# Patient Record
Sex: Female | Born: 1935 | Race: White | Hispanic: No | Smoking: Never smoker
Health system: Southern US, Community
[De-identification: ages and names within clinical notes are randomized; demographics above are authoritative.]

## PROBLEM LIST (undated history)

## (undated) DIAGNOSIS — N289 Disorder of kidney and ureter, unspecified: Secondary | ICD-10-CM

## (undated) DIAGNOSIS — E079 Disorder of thyroid, unspecified: Secondary | ICD-10-CM

## (undated) DIAGNOSIS — J45909 Unspecified asthma, uncomplicated: Secondary | ICD-10-CM

## (undated) DIAGNOSIS — I10 Essential (primary) hypertension: Secondary | ICD-10-CM

## (undated) DIAGNOSIS — I4891 Unspecified atrial fibrillation: Secondary | ICD-10-CM

## (undated) DIAGNOSIS — I509 Heart failure, unspecified: Secondary | ICD-10-CM

## (undated) HISTORY — PX: MASTECTOMY: SHX3

## (undated) HISTORY — PX: ABDOMINAL HYSTERECTOMY: SHX81

---

## 2016-08-09 ENCOUNTER — Emergency Department (HOSPITAL_COMMUNITY)

## 2016-08-09 ENCOUNTER — Emergency Department (HOSPITAL_COMMUNITY)
Admission: EM | Admit: 2016-08-09 | Discharge: 2016-08-09 | Disposition: A | Discharge status: Home / Self Care | Attending: Emergency Medicine | Admitting: Emergency Medicine

## 2016-08-09 ENCOUNTER — Encounter (HOSPITAL_COMMUNITY): Payer: Self-pay | Admitting: Emergency Medicine

## 2016-08-09 DIAGNOSIS — S0003XA Contusion of scalp, initial encounter: Secondary | ICD-10-CM | POA: Insufficient documentation

## 2016-08-09 DIAGNOSIS — Z79899 Other long term (current) drug therapy: Secondary | ICD-10-CM | POA: Insufficient documentation

## 2016-08-09 DIAGNOSIS — I110 Hypertensive heart disease with heart failure: Secondary | ICD-10-CM | POA: Diagnosis not present

## 2016-08-09 DIAGNOSIS — Y999 Unspecified external cause status: Secondary | ICD-10-CM | POA: Insufficient documentation

## 2016-08-09 DIAGNOSIS — M25552 Pain in left hip: Secondary | ICD-10-CM | POA: Insufficient documentation

## 2016-08-09 DIAGNOSIS — I509 Heart failure, unspecified: Secondary | ICD-10-CM | POA: Diagnosis not present

## 2016-08-09 DIAGNOSIS — S40012A Contusion of left shoulder, initial encounter: Secondary | ICD-10-CM

## 2016-08-09 DIAGNOSIS — Y939 Activity, unspecified: Secondary | ICD-10-CM | POA: Diagnosis not present

## 2016-08-09 DIAGNOSIS — J45909 Unspecified asthma, uncomplicated: Secondary | ICD-10-CM | POA: Insufficient documentation

## 2016-08-09 DIAGNOSIS — W1839XA Other fall on same level, initial encounter: Secondary | ICD-10-CM | POA: Diagnosis not present

## 2016-08-09 DIAGNOSIS — Y929 Unspecified place or not applicable: Secondary | ICD-10-CM | POA: Diagnosis not present

## 2016-08-09 DIAGNOSIS — S0990XA Unspecified injury of head, initial encounter: Secondary | ICD-10-CM | POA: Diagnosis present

## 2016-08-09 HISTORY — DX: Disorder of thyroid, unspecified: E07.9

## 2016-08-09 HISTORY — DX: Unspecified atrial fibrillation: I48.91

## 2016-08-09 HISTORY — DX: Essential (primary) hypertension: I10

## 2016-08-09 HISTORY — DX: Disorder of kidney and ureter, unspecified: N28.9

## 2016-08-09 HISTORY — DX: Unspecified asthma, uncomplicated: J45.909

## 2016-08-09 HISTORY — DX: Heart failure, unspecified: I50.9

## 2016-08-09 NOTE — ED Provider Notes (Signed)
[facility]Y DEPT Provider Note   CSN: [id number] Arrival date & time: 08/09/16  0056   By signing my name below, I, Clovis PuAvnee Patel, attest that this documentation has been prepared under the direction and in the presence of Gilda Creasehristopher J Yarden Hillis, MD  Electronically Signed: Clovis PuAvnee Patel, ED Scribe. 08/09/16. 1:15 AM.   History   Chief Complaint Chief Complaint  Patient presents with  . Fall    The history is provided by the patient. No language interpreter was used.   HPI Comments:  Gloria Orozco is a 83 y.o. female, with a hx of neck pain due to neuralgia, A-fib, CHF and HTN, who presents to the Emergency Department, via EMS, complaining of acute onset left sided posterior head pain s/p a fall which occurred PTA. She also reports left shoulder pain,  left hip pain and neck pain. Pt states she fell against a door. No alleviating factors noted. Pt denies syncope, lower extremity pain and any other associated symptoms at this time. Family reports she is on lasix.   Past Medical History:  Diagnosis Date  . Asthma   . Atrial fibrillation (HCC)   . CHF (congestive heart failure) (HCC)   . Hypertension   . Renal disorder   . Thyroid disease     There are no active problems to display for this patient.   Past Surgical History:  Procedure Laterality Date  . ABDOMINAL HYSTERECTOMY    . MASTECTOMY      OB History    No data available       Home Medications    Prior to Admission medications   Medication Sig Start Date End Date Taking? Authorizing Provider  amitriptyline (ELAVIL) 10 MG tablet Take 10 mg by mouth at bedtime.   Yes Historical Provider, MD  Calcium Carbonate-Vitamin D (CALCIUM 600+D PO) Take 1 tablet by mouth every morning.   Yes Historical Provider, MD  cholecalciferol (VITAMIN D) 1000 units tablet Take 1,000 Units by mouth every morning.   Yes Historical Provider, MD  ferrous sulfate 325 (65 FE) MG tablet Take 325 mg by mouth daily with breakfast.   Yes  Historical Provider, MD  gabapentin (NEURONTIN) 100 MG capsule Take 100 mg by mouth daily.   Yes Historical Provider, MD  hydroxychloroquine (PLAQUENIL) 200 MG tablet Take 200 mg by mouth 2 (two) times daily.   Yes Historical Provider, MD  mirtazapine (REMERON) 15 MG tablet Take 15 mg by mouth at bedtime.   Yes Historical Provider, MD  Multiple Vitamin (MULTIVITAMIN WITH MINERALS) TABS tablet Take 1 tablet by mouth every morning.   Yes Historical Provider, MD  nitroGLYCERIN (NITROSTAT) 0.4 MG SL tablet Place 0.4 mg under the tongue every 5 (five) minutes as needed for chest pain.   Yes Historical Provider, MD  polyethylene glycol (MIRALAX / GLYCOLAX) packet Take 17 g by mouth daily as needed for mild constipation.   Yes Historical Provider, MD  traMADol (ULTRAM) 50 MG tablet Take 50 mg by mouth every 6 (six) hours as needed for moderate pain.   Yes Historical Provider, MD  traZODone (DESYREL) 50 MG tablet Take 50 mg by mouth at bedtime as needed for sleep.   Yes Historical Provider, MD  vitamin C (ASCORBIC ACID) 500 MG tablet Take 500 mg by mouth every morning.   Yes Historical Provider, MD    Family History No family history on file.  Social History Social History  Substance Use Topics  . Smoking status: Never Smoker  . Smokeless tobacco: Current  User    Types: Chew, Snuff  . Alcohol use No     Allergies   Patient has no known allergies.   Review of Systems Review of Systems  Musculoskeletal: Positive for arthralgias, myalgias and neck pain.  Neurological: Positive for headaches. Negative for syncope.  All other systems reviewed and are negative.  Physical Exam Updated Vital Signs BP 139/82   Pulse (!) 58   Temp 97.4 F (36.3 C) (Oral)   Resp 17   Ht 5' (1.524 m)   Wt 119 lb (54 kg)   SpO2 98%   BMI 23.24 kg/m   Physical Exam  Constitutional: She is oriented to person, place, and time. She appears well-developed and well-nourished. No distress.  HENT:  Head:  Normocephalic.  Right Ear: Hearing normal.  Left Ear: Hearing normal.  Nose: Nose normal.  Mouth/Throat: Oropharynx is clear and moist and mucous membranes are normal.  Small contusion to left occipital scalp.  Eyes: Conjunctivae and EOM are normal. Pupils are equal, round, and reactive to light.  Neck: Normal range of motion. Neck supple.  Cardiovascular: Regular rhythm, S1 normal and S2 normal.  Exam reveals no gallop and no friction rub.   No murmur heard. Pulmonary/Chest: Effort normal and breath sounds normal. No respiratory distress. She exhibits no tenderness.  Abdominal: Soft. Normal appearance and bowel sounds are normal. There is no hepatosplenomegaly. There is no tenderness. There is no rebound, no guarding, no tenderness at McBurney's point and negative Murphy's sign. No hernia.  Musculoskeletal: Normal range of motion. She exhibits tenderness. She exhibits no deformity.  Tenderness of left paraspinal cervical area and left trapezius muscle. Pain with active ROM of the left hip without deformity.   Neurological: She is alert and oriented to person, place, and time. She has normal strength. No cranial nerve deficit or sensory deficit. Coordination normal. GCS eye subscore is 4. GCS verbal subscore is 5. GCS motor subscore is 6.  Skin: Skin is warm, dry and intact. No rash noted. No cyanosis.  Psychiatric: She has a normal mood and affect. Her speech is normal and behavior is normal. Thought content normal.  Nursing note and vitals reviewed.   ED Treatments / Results  DIAGNOSTIC STUDIES:  Oxygen Saturation is 99% on RA, normal by my interpretation.    COORDINATION OF CARE:  1:08 AM Discussed treatment plan with pt at bedside and pt agreed to plan.  Labs (all labs ordered are listed, but only abnormal results are displayed) Labs Reviewed - No data to display  EKG  EKG Interpretation None       Radiology Ct Head Wo Contrast  Result Date: 08/09/2016 CLINICAL DATA:   Left posterior head pain and neck pain after a fall EXAM: CT HEAD WITHOUT CONTRAST CT CERVICAL SPINE WITHOUT CONTRAST TECHNIQUE: Multidetector CT imaging of the head and cervical spine was performed following the standard protocol without intravenous contrast. Multiplanar CT image reconstructions of the cervical spine were also generated. COMPARISON:  None. FINDINGS: CT HEAD FINDINGS Brain: No acute territorial infarction, intracranial hemorrhage or focal mass lesion is visualized. Old lacunar infarcts in the bilateral basal ganglia. Moderate periventricular white matter hypodensity, consistent with small vessel change. Moderate atrophy. No midline shift. Vascular: No hyperdense vessels.  Carotid artery calcifications. Skull: Mastoid air cells are clear.  No skull fracture. Sinuses/Orbits: Mucosal thickening in the ethmoid sinuses. No acute orbital abnormality. Other: None CT CERVICAL SPINE FINDINGS Alignment: 3 mm retrolisthesis of C3 on C4. Minimal retrolisthesis C5 on C6.  Mild exaggerated cervical lordosis. Facet alignment is maintained. Skull base and vertebrae: Craniovertebral junction is intact. Vertebral body heights are normal. No fracture. Soft tissues and spinal canal: No prevertebral fluid or swelling. No visible canal hematoma. Disc levels: Multilevel degenerative disc changes, moderate at C3-C4. Large posterior disc osteophyte complex at C3-C4 with mass effect on the thecal sac. Bulky calcification posteriorly, likely ligamentous. Additional small posterior calcified disc osteophyte complex at C4-C5, C5-C6 and C6-C7. Multilevel hypertrophic facet arthropathy. There is multilevel foraminal stenosis. Upper chest: Scarring and calcification in the left lung apex. Enlarged left lobe of thyroid with probable multiple hypodense nodules. Carotid artery calcifications. Other: None IMPRESSION: 1. No definite CT evidence for acute intracranial abnormality. 2. No definite acute fracture of the cervical spine.  Electronically Signed   By: Jasmine Pang M.D.   On: 08/09/2016 02:26   Ct Cervical Spine Wo Contrast  Result Date: 08/09/2016 CLINICAL DATA:  Left posterior head pain and neck pain after a fall EXAM: CT HEAD WITHOUT CONTRAST CT CERVICAL SPINE WITHOUT CONTRAST TECHNIQUE: Multidetector CT imaging of the head and cervical spine was performed following the standard protocol without intravenous contrast. Multiplanar CT image reconstructions of the cervical spine were also generated. COMPARISON:  None. FINDINGS: CT HEAD FINDINGS Brain: No acute territorial infarction, intracranial hemorrhage or focal mass lesion is visualized. Old lacunar infarcts in the bilateral basal ganglia. Moderate periventricular white matter hypodensity, consistent with small vessel change. Moderate atrophy. No midline shift. Vascular: No hyperdense vessels.  Carotid artery calcifications. Skull: Mastoid air cells are clear.  No skull fracture. Sinuses/Orbits: Mucosal thickening in the ethmoid sinuses. No acute orbital abnormality. Other: None CT CERVICAL SPINE FINDINGS Alignment: 3 mm retrolisthesis of C3 on C4. Minimal retrolisthesis C5 on C6. Mild exaggerated cervical lordosis. Facet alignment is maintained. Skull base and vertebrae: Craniovertebral junction is intact. Vertebral body heights are normal. No fracture. Soft tissues and spinal canal: No prevertebral fluid or swelling. No visible canal hematoma. Disc levels: Multilevel degenerative disc changes, moderate at C3-C4. Large posterior disc osteophyte complex at C3-C4 with mass effect on the thecal sac. Bulky calcification posteriorly, likely ligamentous. Additional small posterior calcified disc osteophyte complex at C4-C5, C5-C6 and C6-C7. Multilevel hypertrophic facet arthropathy. There is multilevel foraminal stenosis. Upper chest: Scarring and calcification in the left lung apex. Enlarged left lobe of thyroid with probable multiple hypodense nodules. Carotid artery  calcifications. Other: None IMPRESSION: 1. No definite CT evidence for acute intracranial abnormality. 2. No definite acute fracture of the cervical spine. Electronically Signed   By: Jasmine Pang M.D.   On: 08/09/2016 02:26   Dg Shoulder Left  Result Date: 08/09/2016 CLINICAL DATA:  Fall, left shoulder pain EXAM: LEFT SHOULDER - 2+ VIEW COMPARISON:  None. FINDINGS: No acute fracture or dislocation is visualized. There is calcific tendinitis. Left lung apex is clear. Atherosclerosis of the aorta. IMPRESSION: 1. No definite acute osseous abnormality 2. Calcific tendinitis Electronically Signed   By: Jasmine Pang M.D.   On: 08/09/2016 01:54   Dg Hip Unilat W Or Wo Pelvis 2-3 Views Left  Result Date: 08/09/2016 CLINICAL DATA:  Fall with hip pain EXAM: DG HIP (WITH OR WITHOUT PELVIS) 2-3V LEFT COMPARISON:  None. FINDINGS: No acute displaced fracture or dislocation is evident. Pubic symphysis appears intact. Surgical material over the lower pelvis. Dense vascular calcifications in the bilateral groins. IMPRESSION: No definite acute osseous abnormality. Electronically Signed   By: Jasmine Pang M.D.   On: 08/09/2016 01:56    Procedures  Procedures (including critical care time)  Medications Ordered in ED Medications - No data to display   Initial Impression / Assessment and Plan / ED Course  I have reviewed the triage vital signs and the nursing notes.  Pertinent labs & imaging results that were available during my care of the patient were reviewed by me and considered in my medical decision making (see chart for details).    Patient presents to the emergency department for evaluation after a fall. Patient was trying to put her pajamas on, lost her balance and fell backwards, hitting her head on a door jam. No loss of consciousness. Patient landed on her left side, has been complaining of left shoulder pain. There is no deformity noted. She did have some mild pain with range of motion of the left  hip. X-ray of left shoulder, left hip both negative. CT head and cervical spine negative. Majority of her neck pain is on the right side, likely secondary to chronic pain that she has has been attributed to person herpetic neuralgia. CT scan, however, does show significant degenerative disc disease, cannot rule out a component of cervical radiculopathy. There is no acute injury. She does not have any weakness or neurologic deficit of upper extremities. Can follow-up with her primary care doctor.  Final Clinical Impressions(s) / ED Diagnoses   Final diagnoses:  Injury of head, initial encounter  Contusion of left shoulder, initial encounter    New Prescriptions New Prescriptions   No medications on file  I personally performed the services described in this documentation, which was scribed in my presence. The recorded information has been reviewed and is accurate.     Gilda Crease, MD 08/09/16 [phone]

## 2016-08-09 NOTE — ED Notes (Signed)
Patient transported to CT 

## 2016-08-09 NOTE — ED Notes (Signed)
Patient transported to X-ray 

## 2016-08-09 NOTE — ED Triage Notes (Signed)
Per EMS, pt reports falling backwards tonight when trying to put on pajamas. Pt hit head on wooden door. Hematoma to back of head with some right lateral neck pain. Left elbow pain. Pt reports no LOC and does not take blood thinners. Pt has HX of Afib and HTN for which she takes HCTZ. EMS VS BP 212/90 HR 60.

## 2016-09-04 ENCOUNTER — Encounter: Payer: Self-pay | Admitting: Internal Medicine

## 2016-09-04 NOTE — Progress Notes (Signed)
Opened in error     Review of Systems  Physical Exam

## 2016-09-11 ENCOUNTER — Emergency Department (HOSPITAL_COMMUNITY)

## 2016-09-11 ENCOUNTER — Emergency Department (HOSPITAL_COMMUNITY)
Admission: EM | Admit: 2016-09-11 | Discharge: 2016-09-11 | Disposition: A | Discharge status: Home / Self Care | Attending: Emergency Medicine | Admitting: Emergency Medicine

## 2016-09-11 ENCOUNTER — Encounter (HOSPITAL_COMMUNITY): Payer: Self-pay | Admitting: Emergency Medicine

## 2016-09-11 DIAGNOSIS — J45909 Unspecified asthma, uncomplicated: Secondary | ICD-10-CM | POA: Insufficient documentation

## 2016-09-11 DIAGNOSIS — Z79899 Other long term (current) drug therapy: Secondary | ICD-10-CM | POA: Diagnosis not present

## 2016-09-11 DIAGNOSIS — Y929 Unspecified place or not applicable: Secondary | ICD-10-CM | POA: Diagnosis not present

## 2016-09-11 DIAGNOSIS — I110 Hypertensive heart disease with heart failure: Secondary | ICD-10-CM | POA: Diagnosis not present

## 2016-09-11 DIAGNOSIS — W19XXXA Unspecified fall, initial encounter: Secondary | ICD-10-CM | POA: Insufficient documentation

## 2016-09-11 DIAGNOSIS — Y999 Unspecified external cause status: Secondary | ICD-10-CM | POA: Diagnosis not present

## 2016-09-11 DIAGNOSIS — I509 Heart failure, unspecified: Secondary | ICD-10-CM | POA: Diagnosis not present

## 2016-09-11 DIAGNOSIS — M25511 Pain in right shoulder: Secondary | ICD-10-CM | POA: Diagnosis not present

## 2016-09-11 DIAGNOSIS — M25552 Pain in left hip: Secondary | ICD-10-CM | POA: Diagnosis present

## 2016-09-11 DIAGNOSIS — Y939 Activity, unspecified: Secondary | ICD-10-CM | POA: Diagnosis not present

## 2016-09-11 NOTE — ED Provider Notes (Signed)
[facility] Provider Note   CSN: [id number] Arrival date & time: 09/11/16  1646     History   Chief Complaint Chief Complaint  Patient presents with  . Fall    HPI [place] Gloria Orozco is a 83 y.o. female.  Patient had a fall today. She complains of some left hip and right shoulder pain. Patient is a hospice patient. She lives at home with her daughter. She is going to a care facility tomorrow. She is also [facility] NOT [facility]CIT[facility]   The history is provided by the patient and a relative. No language interpreter was used.  Fall  This is a new problem. The current episode started 3 to 5 hours ago. The problem occurs rarely. The problem has been resolved. Pertinent negatives include no abdominal pain and no headaches. Nothing aggravates the symptoms. Nothing relieves the symptoms.    Past Medical History:  Diagnosis Date  . Asthma   . Atrial fibrillation (HCC)   . CHF (congestive heart failure) (HCC)   . Hypertension   . Renal disorder   . Thyroid disease     There are no active problems to display for this patient.   Past Surgical History:  Procedure Laterality Date  . ABDOMINAL HYSTERECTOMY    . MASTECTOMY      OB History    No data available       Home Medications    Prior to Admission medications   Medication Sig Start Date End Date Taking? Authorizing Provider  acetaminophen (TYLENOL) 650 MG suppository Place 650 mg rectally every 6 (six) hours as needed for mild pain or fever.    Historical Provider, MD  amitriptyline (ELAVIL) 10 MG tablet Take 10 mg by mouth at bedtime.    Historical Provider, MD  bisacodyl (DULCOLAX) 10 MG suppository Place 10 mg rectally daily as needed for moderate constipation.    Historical Provider, MD  Calcium Carbonate-Vitamin D (CALCIUM 600+D PO) Take 1 tablet by mouth every morning.    Historical Provider, MD  ferrous sulfate 325 (65 FE) MG tablet Take 325 mg by mouth daily with breakfast.    Historical Provider, MD  furosemide  (LASIX) 20 MG tablet Take 20 mg by mouth 3 (three) times a week. Monday, Wednesday and Friday    Historical Provider, MD  gabapentin (NEURONTIN) 300 MG capsule Take 300 mg by mouth 2 (two) times daily.    Historical Provider, MD  haloperidol (HALDOL) 2 MG/ML solution Take by mouth every 6 (six) hours as needed for agitation. Take 1/2 mL    Historical Provider, MD  hydroxychloroquine (PLAQUENIL) 200 MG tablet Take 200 mg by mouth 2 (two) times daily.    Historical Provider, MD  hyoscyamine (ANASPAZ) 0.125 MG TBDP disintergrating tablet Place 0.125 mg under the tongue as needed for bladder spasms.    Historical Provider, MD  levothyroxine (SYNTHROID, LEVOTHROID) 112 MCG tablet Take 112 mcg by mouth daily before breakfast.    Historical Provider, MD  LORazepam (ATIVAN) 0.5 MG tablet Take 0.5 mg by mouth every 6 (six) hours as needed for anxiety.    Historical Provider, MD  mirtazapine (REMERON) 15 MG tablet Take 15 mg by mouth at bedtime.    Historical Provider, MD  morphine 20 MG/5ML solution Take by mouth every 3 (three) hours as needed for pain. Take 0.25 mL    Historical Provider, MD  Multiple Vitamin (MULTIVITAMIN WITH MINERALS) TABS tablet Take 1 tablet by mouth every morning.    Historical Provider, MD  nitroGLYCERIN (  NITROSTAT) 0.4 MG SL tablet Place 0.4 mg under the tongue every 5 (five) minutes as needed for chest pain.    Historical Provider, MD  polyethylene glycol (MIRALAX / GLYCOLAX) packet Take 17 g by mouth daily as needed for mild constipation.    Historical Provider, MD  prochlorperazine (COMPAZINE) 10 MG tablet Take 10 mg by mouth every 6 (six) hours as needed for nausea or vomiting.    Historical Provider, MD  senna (SENOKOT) 8.6 MG tablet Take 1 tablet by mouth daily as needed for constipation.    Historical Provider, MD  traMADol (ULTRAM) 50 MG tablet Take 50-100 mg by mouth every 4 (four) hours as needed for moderate pain.     Historical Provider, MD  traZODone (DESYREL) 50 MG  tablet Take 25 mg by mouth at bedtime.     Historical Provider, MD  vitamin C (ASCORBIC ACID) 500 MG tablet Take 500 mg by mouth every morning.    Historical Provider, MD    Family History No family history on file.  Social History Social History  Substance Use Topics  . Smoking status: Never Smoker  . Smokeless tobacco: Current User    Types: Chew, Snuff  . Alcohol use No     Allergies   Patient has no known allergies.   Review of Systems Review of Systems  Constitutional: Negative for appetite change and fatigue.  HENT: Negative for congestion, ear discharge and sinus pressure.   Eyes: Negative for discharge.  Respiratory: Negative for cough.   Cardiovascular: Negative for palpitations.  Gastrointestinal: Negative for abdominal pain and diarrhea.  Genitourinary: Negative for frequency and hematuria.  Musculoskeletal: Negative for back pain.       Mild tender left hip and right shoulder  Skin: Negative for rash.  Neurological: Negative for seizures and headaches.  Psychiatric/Behavioral: Negative for hallucinations.     Physical Exam Updated Vital Signs BP (!) 159/49 (BP Location: Right Arm)   Pulse (!) 52   Temp 97.5 F (36.4 C) (Oral)   Resp 16   SpO2 100%   Physical Exam  Constitutional: She is oriented to person, place, and time. She appears well-developed.  HENT:  Head: Normocephalic.  Eyes: Conjunctivae and EOM are normal. No scleral icterus.  Neck: Neck supple. No thyromegaly present.  Cardiovascular: Normal rate and regular rhythm.  Exam reveals no gallop and no friction rub.   No murmur heard. Pulmonary/Chest: No stridor. She has no wheezes. She has no rales. She exhibits no tenderness.  Abdominal: She exhibits no distension. There is no tenderness. There is no rebound.  Musculoskeletal: Normal range of motion. She exhibits no edema.  Patient has mild tenderness to left hip and right shoulder  Lymphadenopathy:    She has no cervical adenopathy.    Neurological: She is oriented to person, place, and time. She exhibits normal muscle tone. Coordination normal.  Skin: No rash noted. No erythema.  Psychiatric: She has a normal mood and affect. Her behavior is normal.     ED Treatments / Results  Labs (all labs ordered are listed, but only abnormal results are displayed) Labs Reviewed - No data to display  EKG  EKG Interpretation None       Radiology Dg Shoulder Right  Result Date: 09/11/2016 CLINICAL DATA:  Status post fall, with anterior and superior right shoulder pain. Initial encounter. EXAM: RIGHT SHOULDER - 2+ VIEW COMPARISON:  None. FINDINGS: There is no evidence of acute fracture or dislocation. A likely chronic Hill-Sachs lesion is  noted. The right humeral head is seated within the glenoid fossa. The acromioclavicular joint is unremarkable in appearance. No significant soft tissue abnormalities are seen. The visualized portions of the right lung are clear. IMPRESSION: 1. No evidence of acute fracture or dislocation. 2. Likely chronic Hill-Sachs lesion noted. Electronically Signed   By: Roanna RaiderJeffery  Chang M.D.   On: 09/11/2016 18:05   Dg Hip Unilat W Or Wo Pelvis 2-3 Views Left  Result Date: 09/11/2016 CLINICAL DATA:  Status post fall, with left hip pain. Initial encounter. EXAM: DG HIP (WITH OR WITHOUT PELVIS) 2-3V LEFT COMPARISON:  Left hip radiographs from 08/09/2016 FINDINGS: There is no evidence of fracture or dislocation. Both femoral heads are seated normally within their respective acetabula. The proximal left femur appears intact. No significant degenerative change is appreciated. The sacroiliac joints are unremarkable in appearance. The visualized bowel gas pattern is grossly unremarkable in appearance. Diffuse vascular calcifications are seen. Postoperative change is noted about the pelvis. IMPRESSION: 1. No definite evidence of fracture or dislocation. 2. Diffuse vascular calcifications seen. Electronically Signed   By:  Roanna RaiderJeffery  Chang M.D.   On: 09/11/2016 18:04    Procedures Procedures (including critical care time)  Medications Ordered in ED Medications - No data to display   Initial Impression / Assessment and Plan / ED Course  I have reviewed the triage vital signs and the nursing notes.  Pertinent labs & imaging results that were available during my care of the patient were reviewed by me and considered in my medical decision making (see chart for details).     Patient with a history of fall with tenderness and contusion to left hip and right shoulder. She is a hospice patient at the [facility] that is going to a facility tomorrow to be taken care of. Patient will be discharged home to take her tramadol for pain patient normally ambulates very slowly with a walker. I instructed her daughter now that she has pain in her shoulder and hip that she should travel in a wheelchair  Final Clinical Impressions(s) / ED Diagnoses   Final diagnoses:  Fall, initial encounter    New Prescriptions New Prescriptions   No medications on file     Bethann BerkshireJoseph Keymarion Bearman, MD 09/11/16 [phone]

## 2016-09-11 NOTE — ED Notes (Signed)
Patient transported to X-ray 

## 2016-09-11 NOTE — ED Triage Notes (Signed)
Pt adds that she did hit her head when she fell and also has pain in both shoulders

## 2016-09-11 NOTE — Discharge Instructions (Signed)
Take your tramadol for pain. Get rechecked if not improving. Patient may go to the respite care area tomorrow

## 2016-09-11 NOTE — ED Triage Notes (Signed)
Per EMS pt fell today pt does not know what happened pt c/o R hip pain and then started c/o chest pain in ambulance. Pt has a heart block but family does not know what type of heart block pt also has a DNR but does not have copy of DNR

## 2016-10-30 ENCOUNTER — Ambulatory Visit (INDEPENDENT_AMBULATORY_CARE_PROVIDER_SITE_OTHER): Payer: Medicare Other | Admitting: Podiatry

## 2016-10-30 ENCOUNTER — Encounter: Payer: Self-pay | Admitting: Podiatry

## 2016-10-30 DIAGNOSIS — M722 Plantar fascial fibromatosis: Secondary | ICD-10-CM | POA: Diagnosis not present

## 2016-10-30 DIAGNOSIS — B351 Tinea unguium: Secondary | ICD-10-CM

## 2016-10-30 DIAGNOSIS — M79605 Pain in left leg: Secondary | ICD-10-CM | POA: Diagnosis not present

## 2016-10-30 DIAGNOSIS — M79604 Pain in right leg: Secondary | ICD-10-CM | POA: Diagnosis not present

## 2016-10-30 MED ORDER — TRIAMCINOLONE ACETONIDE 10 MG/ML IJ SUSP
10.0000 mg | Freq: Once | INTRAMUSCULAR | Status: AC
  Administered 2016-10-30: 10 mg

## 2016-10-30 NOTE — Progress Notes (Signed)
   Subjective:    Patient ID: Gloria Orozco, female    DOB: December 27, 1920, 82 y.o.   MRN: [id number]  HPI Chief Complaint  Patient presents with  . Debridement    Bilateral nail trim  . Nail Problem    Right foot; great toe-lateral side; pt's daughter stated, "Wants to have checked for ingrown; would prefer to have trimmed out if can"      Review of Systems  All other systems reviewed and are negative.      Objective:   Physical Exam        Assessment & Plan:

## 2016-10-30 NOTE — Patient Instructions (Signed)

## 2016-11-01 NOTE — Progress Notes (Signed)
Subjective:     Patient ID: Gloria Orozco, female   DOB: 02-06-1921, 83 y.o.   MRN: [id number]  HPI patient presents stating she has very elongated nails 1-5 both feet that she cannot cut and they become painful with shoe gear   Review of Systems  All other systems reviewed and are negative.      Objective:   Physical Exam  Constitutional: She is oriented to person, place, and time.  Cardiovascular: Intact distal pulses.   Musculoskeletal: Normal range of motion.  Neurological: She is oriented to person, place, and time.  Skin: Skin is warm and dry.  Nursing note and vitals reviewed.  Neurologically mild diminishment of pulses 1-5 both feet with diminished sharp dull and vibratory with patient noted to have thick yellow brittle nailbeds 1-5 both feet that are painful when pressed and hard to wear shoe gear with. Patient's found have good digital perfusion and is well oriented     Assessment:     Mycotic nail infection 1-5 both feet with pain    Plan:     H&P performed debridement of nailbeds 1-5 both feet with no iatrogenic bleeding and reappoint for routine care

## 2016-11-19 DEATH — deceased

## 2017-02-03 ENCOUNTER — Ambulatory Visit: Payer: Medicare Other

## 2017-05-20 NOTE — Patient Instructions (Signed)
Opened in error

## 2017-08-05 IMAGING — CT CT HEAD W/O CM
5 of 7 series · 17 of 47 positions shown, 18 images · non-contrast
Comparison: None.

CLINICAL DATA: Left posterior head pain and neck pain after a fall

EXAM:
CT HEAD WITHOUT CONTRAST
CT CERVICAL SPINE WITHOUT CONTRAST
TECHNIQUE: Multidetector CT imaging of the head and cervical spine was
performed following the standard protocol without intravenous
contrast. Multiplanar CT image reconstructions of the cervical spine
were also generated.

[Series 2: head without · axial · non-contrast · 0.41mm/px · z∈[+1167,+1247]mm · 3 of 34 slices shown, 4 images]
[im 9/34  brain]
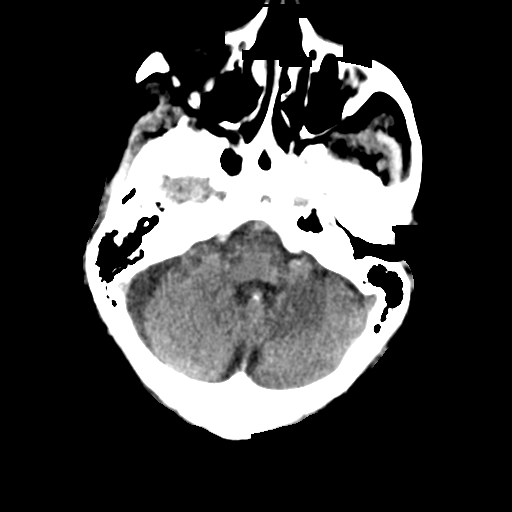
[im 9/34  bone]
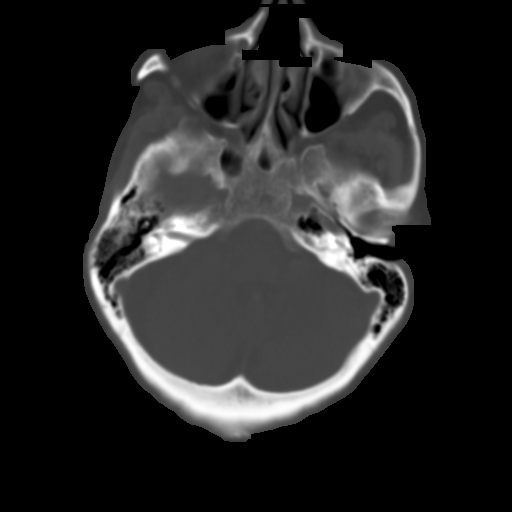
[im 17/34  brain]
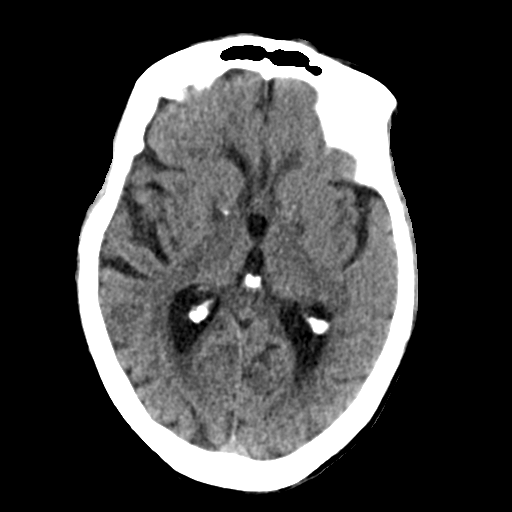
[im 25/34  brain]
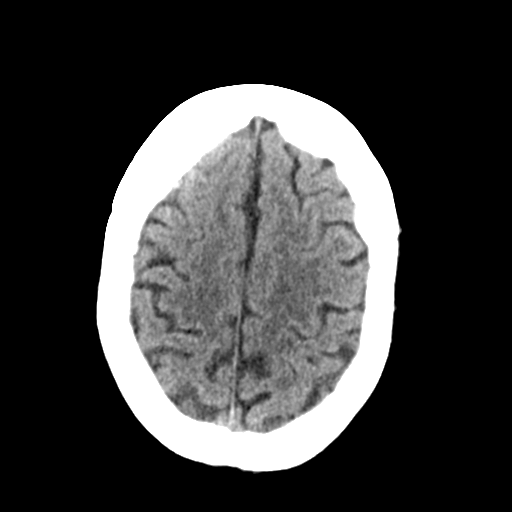

[Series 3: head bone · axial · 0.41mm/px · z∈[+1139,+1279]mm · 8 of 84 slices shown]
[im 7/84  bone]
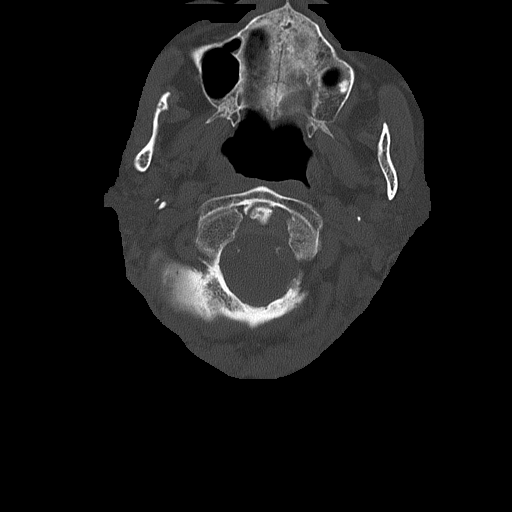
[im 21/84  bone]
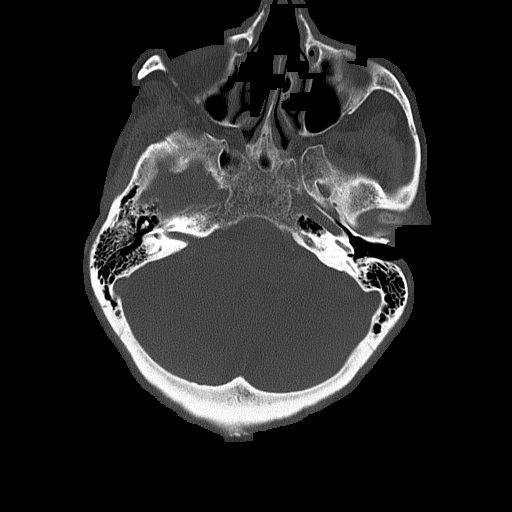
[im 28/84  bone]
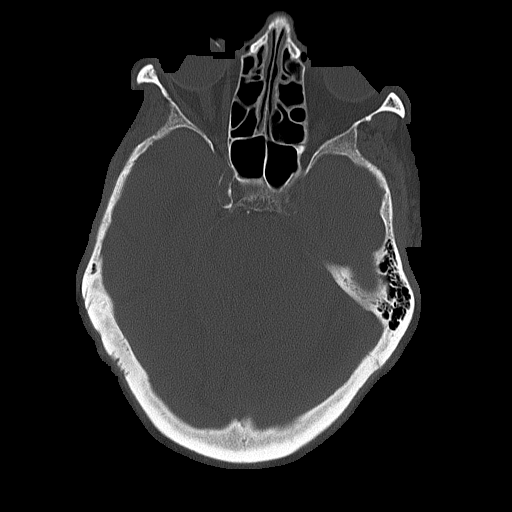
[im 35/84  bone]
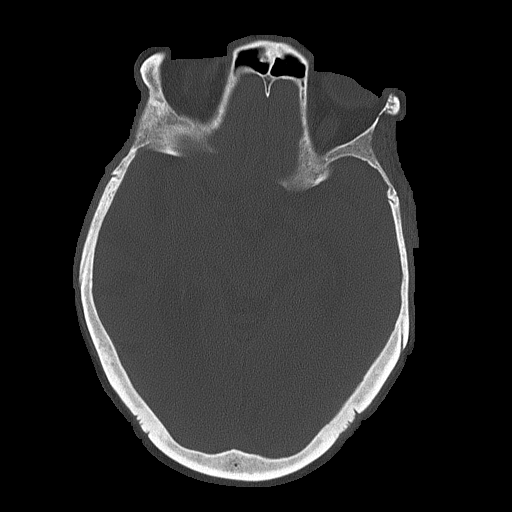
[im 49/84  bone]
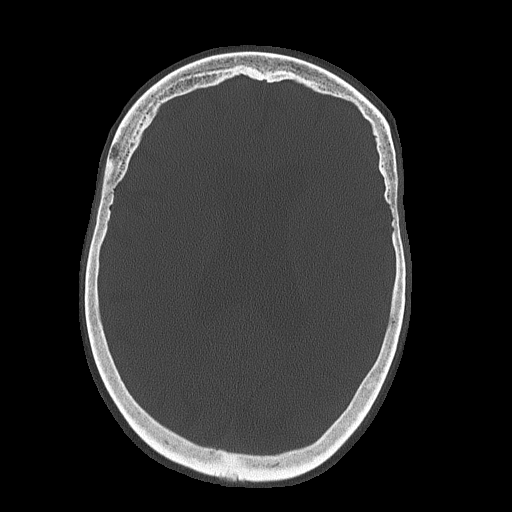
[im 56/84  bone]
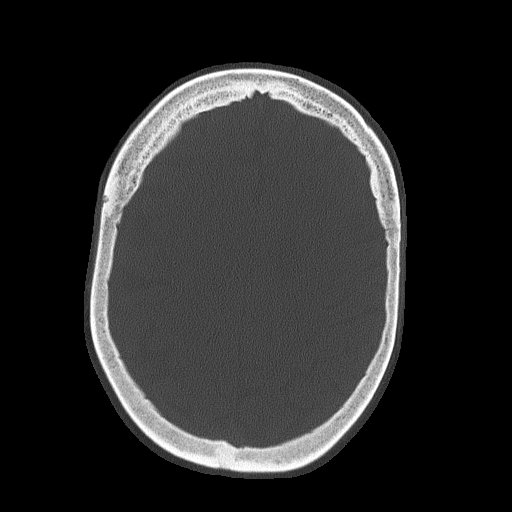
[im 63/84  bone]
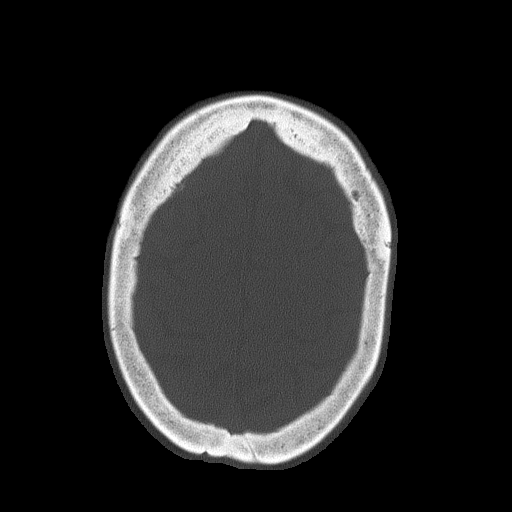
[im 77/84  bone]
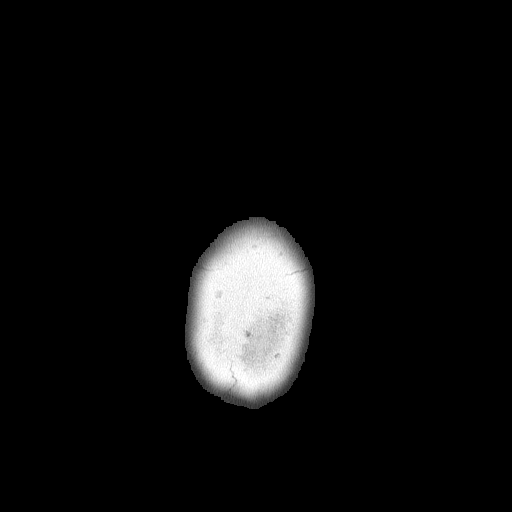

[Series 4: head without cor · coronal · non-contrast · 0.33mm/px · 3 of 67 slices shown]
[im 17/67  brain]
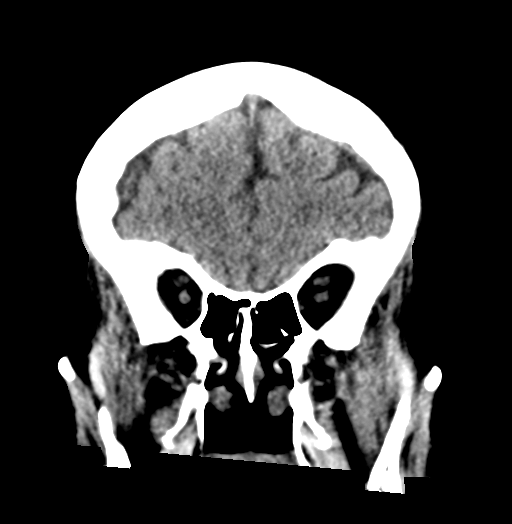
[im 34/67  brain]
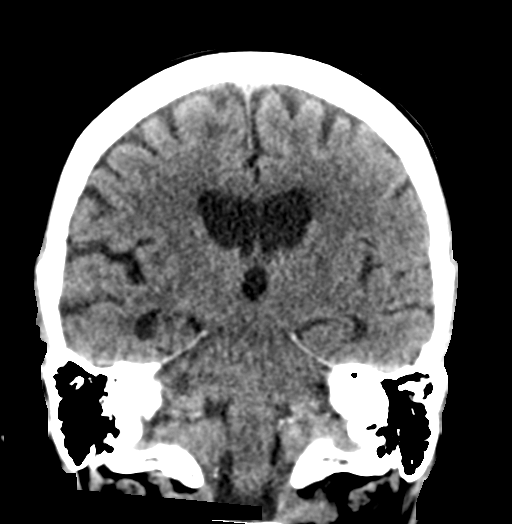
[im 50/67  brain]
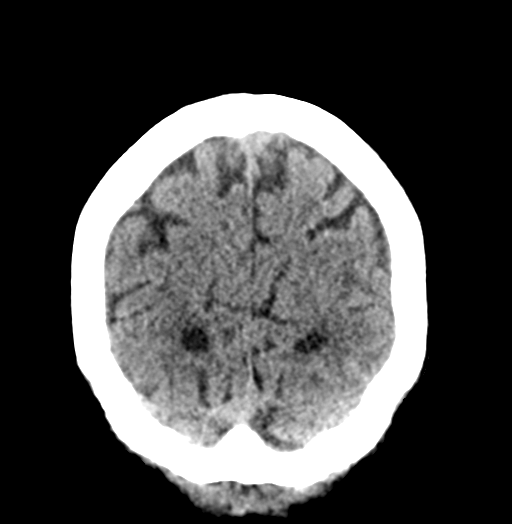

[Series 5: head without sag · sagittal · non-contrast · 0.33mm/px · 1 of 52 slices shown]
[im 26/52  brain]
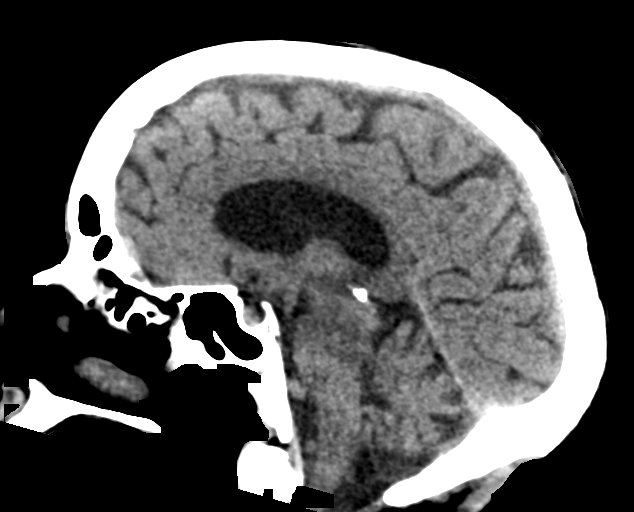

[Series 6: c_spine 2.0 st · axial · 0.40mm/px · z∈[+994,+1008]mm · 2 of 79 slices shown]
[im 8/79  brain]
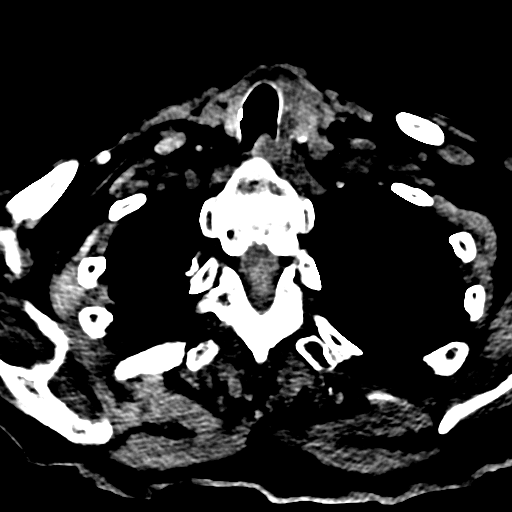
[im 15/79  brain]
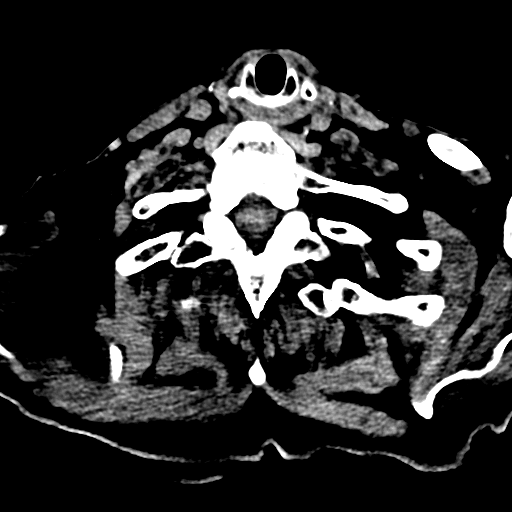

[17 of 47 positions shown; findings below may reference images not displayed]

FINDINGS: CT HEAD FINDINGS

Brain: No acute territorial infarction, intracranial hemorrhage or
focal mass lesion is visualized. Old lacunar infarcts in the
bilateral basal ganglia. Moderate periventricular white matter
hypodensity, consistent with small vessel change. Moderate atrophy.
No midline shift.

Vascular: No hyperdense vessels.  Carotid artery calcifications.

Skull: Mastoid air cells are clear.  No skull fracture.

Sinuses/Orbits: Mucosal thickening in the ethmoid sinuses. No acute
orbital abnormality.

Other: None

CT CERVICAL SPINE FINDINGS

Alignment: 3 mm retrolisthesis of C3 on C4. Minimal retrolisthesis
C5 on C6. Mild exaggerated cervical lordosis. Facet alignment is
maintained.

Skull base and vertebrae: Craniovertebral junction is intact.
Vertebral body heights are normal. No fracture.

Soft tissues and spinal canal: No prevertebral fluid or swelling. No
visible canal hematoma.

Disc levels: Multilevel degenerative disc changes, moderate at
C3-C4. Large posterior disc osteophyte complex at C3-C4 with mass
effect on the thecal sac. Bulky calcification posteriorly, likely
ligamentous. Additional small posterior calcified disc osteophyte
complex at C4-C5, C5-C6 and C6-C7. Multilevel hypertrophic facet
arthropathy. There is multilevel foraminal stenosis.

Upper chest: Scarring and calcification in the left lung apex.
Enlarged left lobe of thyroid with probable multiple hypodense
nodules. Carotid artery calcifications.

Other: None
IMPRESSION: 1. No definite CT evidence for acute intracranial abnormality.
2. No definite acute fracture of the cervical spine.

## 2017-09-07 IMAGING — CR DG HIP (WITH OR WITHOUT PELVIS) 2-3V*L*
3 series · 3 of 3 positions shown · non-contrast
Comparison: Left hip radiographs from 08/09/2016

CLINICAL DATA: Status post fall, with left hip pain. Initial
encounter.

EXAM:
DG HIP (WITH OR WITHOUT PELVIS) 2-3V LEFT

[pelvis ap]
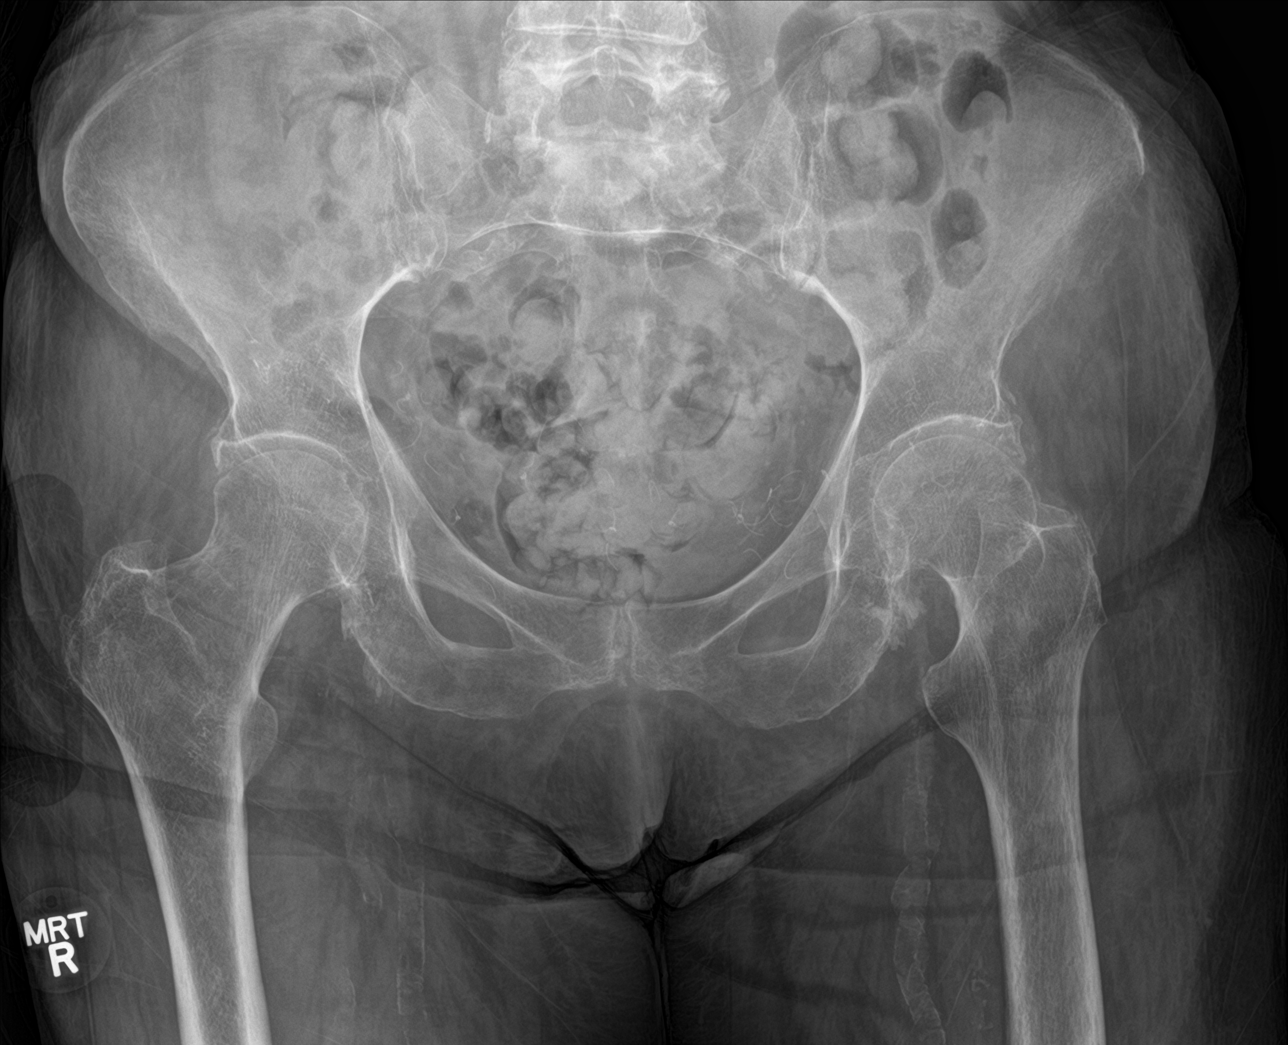

[hip ap]
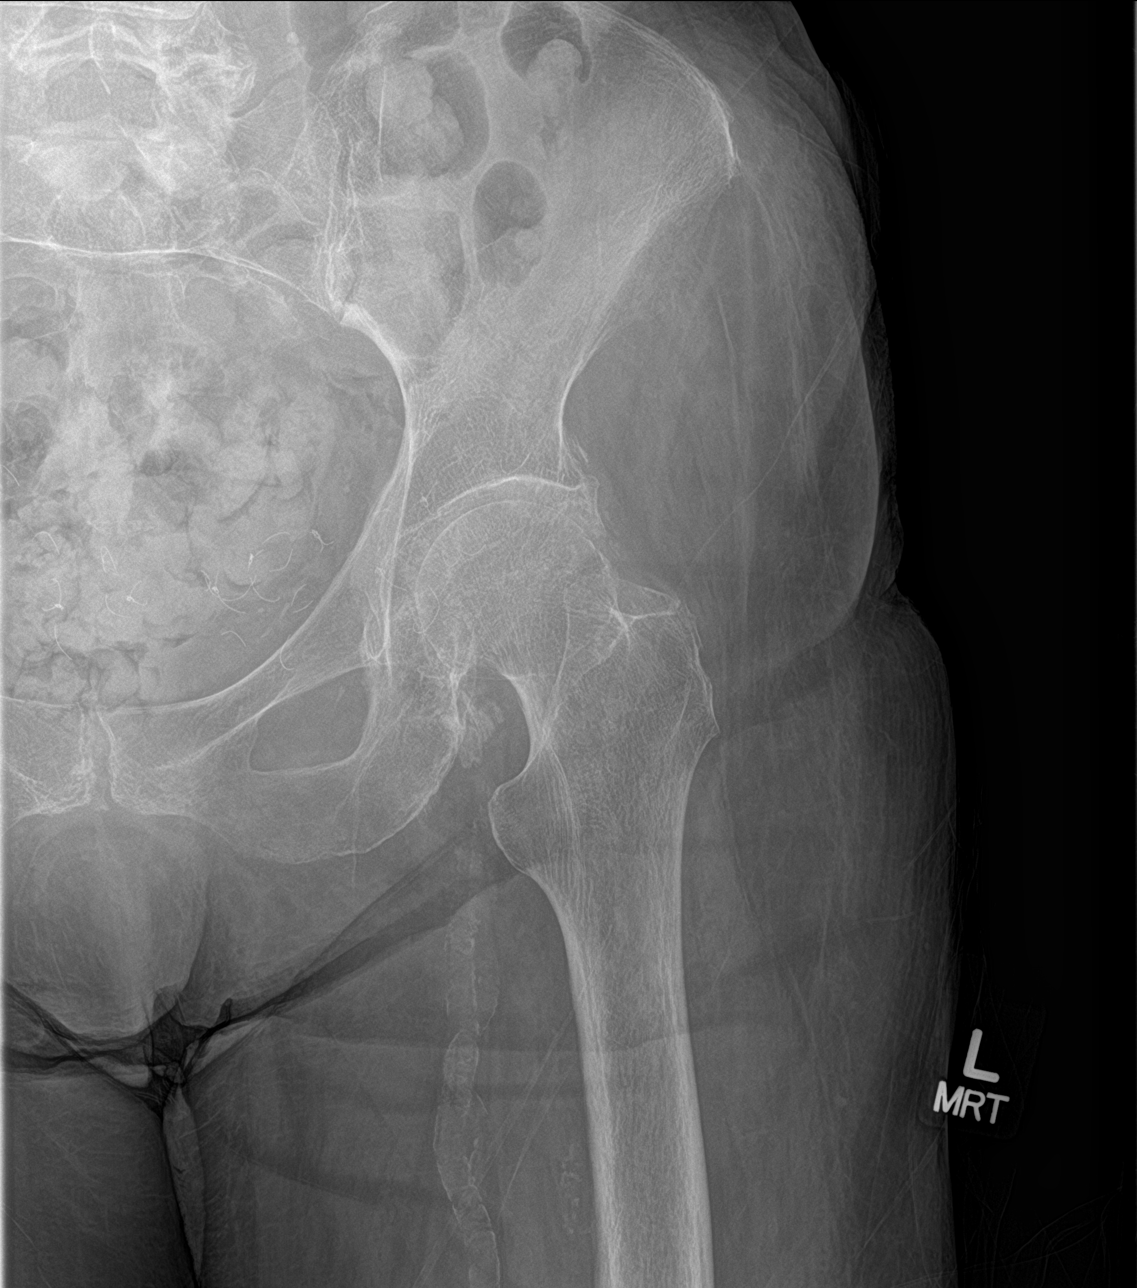

[hip lat]
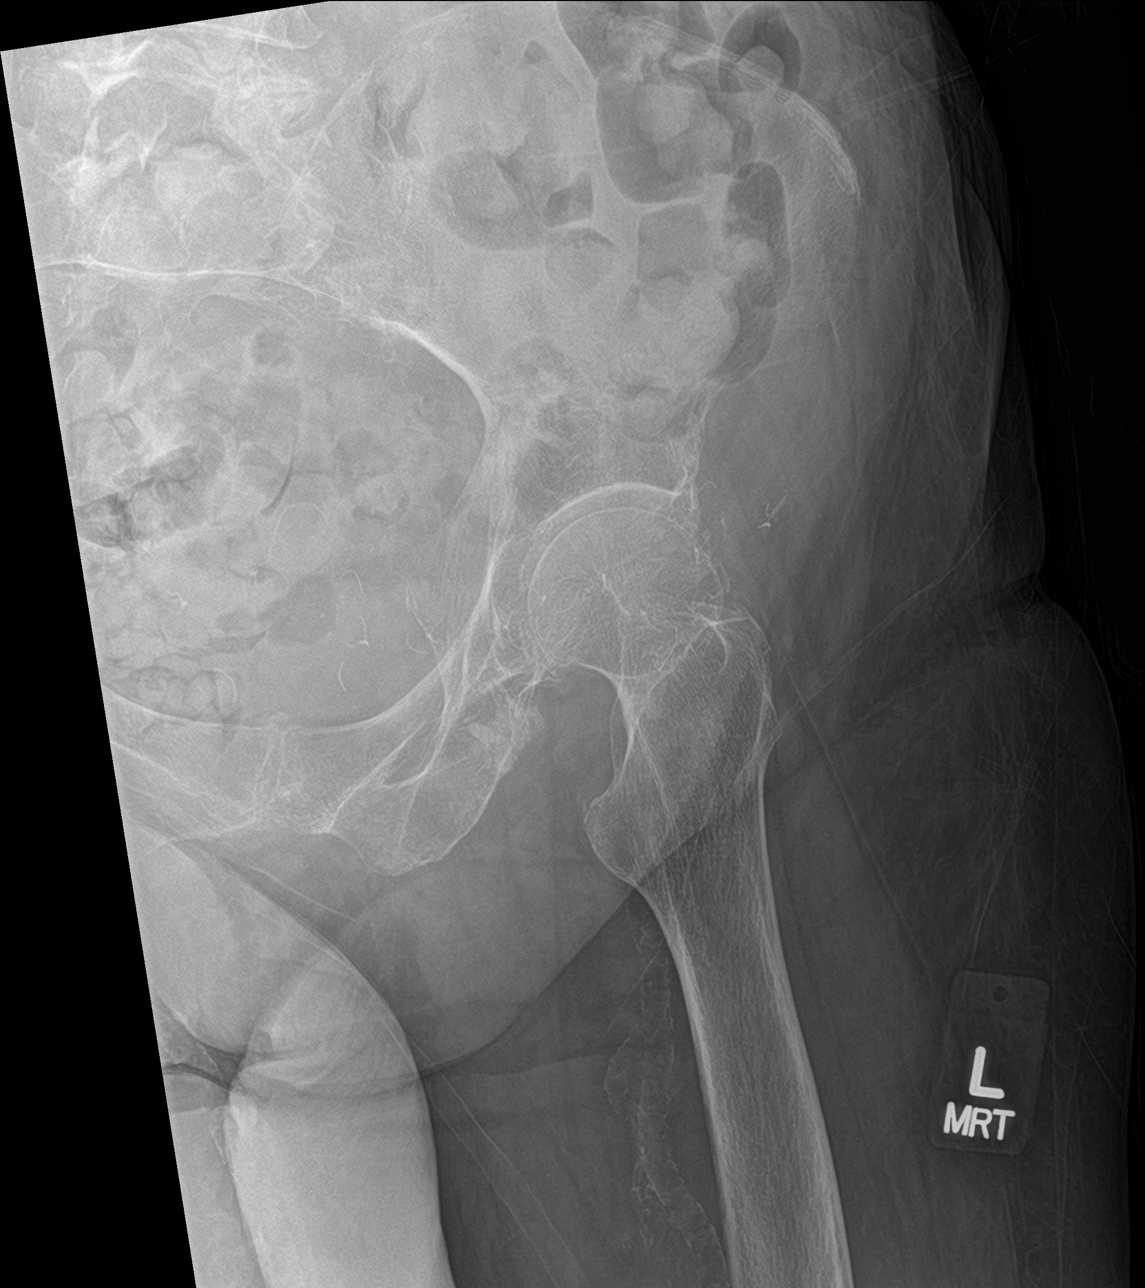

[3 of 3 positions shown; findings below may reference images not displayed]

FINDINGS: There is no evidence of fracture or dislocation. Both femoral heads
are seated normally within their respective acetabula. The proximal
left femur appears intact. No significant degenerative change is
appreciated. The sacroiliac joints are unremarkable in appearance.

The visualized bowel gas pattern is grossly unremarkable in
appearance. Diffuse vascular calcifications are seen. Postoperative
change is noted about the pelvis.
IMPRESSION: 1. No definite evidence of fracture or dislocation.
2. Diffuse vascular calcifications seen.
